# Patient Record
Sex: Male | Born: 1980 | Race: White | Hispanic: No | Marital: Single | Smoking: Former smoker
Health system: Southern US, Community
[De-identification: ages and names within clinical notes are randomized; demographics above are authoritative.]

## PROBLEM LIST (undated history)

## (undated) HISTORY — PX: WRIST SURGERY: SHX841

---

## 1999-04-26 ENCOUNTER — Observation Stay (HOSPITAL_COMMUNITY): Admission: EM | Admit: 1999-04-26 | Discharge: 1999-04-26 | Payer: Self-pay | Admitting: *Deleted

## 1999-05-07 ENCOUNTER — Encounter: Admission: RE | Admit: 1999-05-07 | Discharge: 1999-06-03 | Payer: Self-pay | Admitting: Orthopedic Surgery

## 1999-06-03 ENCOUNTER — Encounter: Admission: RE | Admit: 1999-06-03 | Discharge: 1999-09-01 | Payer: Self-pay | Admitting: Orthopedic Surgery

## 1999-07-27 ENCOUNTER — Encounter: Payer: Self-pay | Admitting: Emergency Medicine

## 1999-07-27 ENCOUNTER — Emergency Department (HOSPITAL_COMMUNITY): Admission: EM | Admit: 1999-07-27 | Discharge: 1999-07-27 | Payer: Self-pay | Admitting: Emergency Medicine

## 2000-03-21 ENCOUNTER — Encounter: Payer: Self-pay | Admitting: Emergency Medicine

## 2000-03-21 ENCOUNTER — Emergency Department (HOSPITAL_COMMUNITY): Admission: EM | Admit: 2000-03-21 | Discharge: 2000-03-22 | Payer: Self-pay | Admitting: Emergency Medicine

## 2000-08-13 ENCOUNTER — Emergency Department (HOSPITAL_COMMUNITY): Admission: EM | Admit: 2000-08-13 | Discharge: 2000-08-13 | Payer: Self-pay | Admitting: Podiatry

## 2003-03-25 ENCOUNTER — Inpatient Hospital Stay (HOSPITAL_COMMUNITY): Admission: EM | Admit: 2003-03-25 | Discharge: 2003-03-26 | Payer: Self-pay | Admitting: *Deleted

## 2004-07-06 ENCOUNTER — Emergency Department (HOSPITAL_COMMUNITY): Admission: EM | Admit: 2004-07-06 | Discharge: 2004-07-06 | Payer: Self-pay | Admitting: Family Medicine

## 2004-07-07 ENCOUNTER — Ambulatory Visit (HOSPITAL_COMMUNITY): Admission: RE | Admit: 2004-07-07 | Discharge: 2004-07-07 | Payer: Self-pay | Admitting: *Deleted

## 2004-07-07 ENCOUNTER — Ambulatory Visit (HOSPITAL_BASED_OUTPATIENT_CLINIC_OR_DEPARTMENT_OTHER): Admission: RE | Admit: 2004-07-07 | Discharge: 2004-07-07 | Payer: Self-pay | Admitting: *Deleted

## 2004-09-16 ENCOUNTER — Emergency Department (HOSPITAL_COMMUNITY): Admission: EM | Admit: 2004-09-16 | Discharge: 2004-09-16 | Payer: Self-pay | Admitting: Emergency Medicine

## 2006-03-01 ENCOUNTER — Emergency Department (HOSPITAL_COMMUNITY): Admission: EM | Admit: 2006-03-01 | Discharge: 2006-03-01 | Payer: Self-pay | Admitting: Emergency Medicine

## 2006-03-30 ENCOUNTER — Emergency Department (HOSPITAL_COMMUNITY): Admission: EM | Admit: 2006-03-30 | Discharge: 2006-03-31 | Payer: Self-pay | Admitting: *Deleted

## 2007-06-22 ENCOUNTER — Emergency Department (HOSPITAL_COMMUNITY): Admission: EM | Admit: 2007-06-22 | Discharge: 2007-06-23 | Payer: Self-pay | Admitting: Emergency Medicine

## 2010-04-10 ENCOUNTER — Emergency Department (HOSPITAL_COMMUNITY): Admission: EM | Admit: 2010-04-10 | Discharge: 2010-04-10 | Payer: Self-pay | Admitting: Emergency Medicine

## 2010-09-02 ENCOUNTER — Emergency Department (HOSPITAL_COMMUNITY)
Admission: EM | Admit: 2010-09-02 | Discharge: 2010-09-02 | Disposition: A | Discharge status: Home / Self Care | Payer: No Typology Code available for payment source | Attending: Emergency Medicine | Admitting: Emergency Medicine

## 2010-09-02 ENCOUNTER — Emergency Department (HOSPITAL_COMMUNITY): Payer: No Typology Code available for payment source

## 2010-09-02 DIAGNOSIS — Z23 Encounter for immunization: Secondary | ICD-10-CM | POA: Insufficient documentation

## 2010-09-02 DIAGNOSIS — S51009A Unspecified open wound of unspecified elbow, initial encounter: Secondary | ICD-10-CM | POA: Insufficient documentation

## 2010-09-02 DIAGNOSIS — M542 Cervicalgia: Secondary | ICD-10-CM | POA: Insufficient documentation

## 2010-09-02 DIAGNOSIS — S5000XA Contusion of unspecified elbow, initial encounter: Secondary | ICD-10-CM | POA: Insufficient documentation

## 2010-09-02 DIAGNOSIS — M25529 Pain in unspecified elbow: Secondary | ICD-10-CM | POA: Insufficient documentation

## 2010-09-02 DIAGNOSIS — IMO0002 Reserved for concepts with insufficient information to code with codable children: Secondary | ICD-10-CM | POA: Insufficient documentation

## 2010-09-08 ENCOUNTER — Emergency Department (HOSPITAL_COMMUNITY)
Admission: EM | Admit: 2010-09-08 | Discharge: 2010-09-09 | Disposition: A | Discharge status: Home / Self Care | Payer: No Typology Code available for payment source | Attending: Emergency Medicine | Admitting: Emergency Medicine

## 2010-09-08 DIAGNOSIS — M542 Cervicalgia: Secondary | ICD-10-CM | POA: Insufficient documentation

## 2010-09-08 DIAGNOSIS — M25519 Pain in unspecified shoulder: Secondary | ICD-10-CM | POA: Insufficient documentation

## 2010-09-08 DIAGNOSIS — M549 Dorsalgia, unspecified: Secondary | ICD-10-CM | POA: Insufficient documentation

## 2010-09-08 DIAGNOSIS — M79609 Pain in unspecified limb: Secondary | ICD-10-CM | POA: Insufficient documentation

## 2010-09-08 DIAGNOSIS — T148XXA Other injury of unspecified body region, initial encounter: Secondary | ICD-10-CM | POA: Insufficient documentation

## 2010-09-08 DIAGNOSIS — Y929 Unspecified place or not applicable: Secondary | ICD-10-CM | POA: Insufficient documentation

## 2010-10-10 NOTE — Op Note (Signed)
NAMEDONTARIOUS, SCHAUM               ACCOUNT NO.:  [id number]   MEDICAL RECORD NO.:  [id number]          PATIENT TYPE:  AMB   LOCATION:  DSC                          FACILITY:  [facility]   PHYSICIAN:  Lowell Bouton, M.D.DATE OF BIRTH:  September 07, 1980   DATE OF PROCEDURE:  07/07/2004  DATE OF DISCHARGE:                                 OPERATIVE REPORT   PREOP DIAGNOSIS:  Septic arthritis left middle finger PIP joint.   POSTOP DIAGNOSIS:  Septic arthritis left middle finger PIP joint.   PROCEDURE:  Procedure is I&D of septic arthritis left middle finger PIP  joint.   SURGEON:  Lowell Bouton, M.D.   ANESTHESIA:  General.   OPERATIVE FINDINGS:  The patient had an erythematous infected laceration  just dorsal to the PIP joint that extended down through the central slip.  The joint itself had thick fluid present.   PROCEDURE:  Under general anesthesia with a tourniquet on the left arm, the  left hand was prepped and draped in the usual fashion. After elevating the  limb the tourniquet was inflated to 250 mmHg. The previous laceration was  spread open and the scab was excised. Laceration was extended in a curved  fashion longitudinally on the dorsum of the PIP of the left middle finger.  Sharp dissection was carried down to the extensor mechanism. The razor had  cut through a portion of the central slip. A longitudinal incision was then  made in the extensor mechanism. An arthrotomy of the joint was performed.  Purulent material was obtained and cultures were sent to the lab. The wound  was irrigated copiously with saline and an Iodoform packing measuring 1/4  inch in width was inserted into the joint. The wound was partially closed  with 4-0 nylon sutures. Sterile dressings were applied. 0.5% Marcaine was  inserted for pain control and the patient was placed in a sterile dressing  with a splint on the finger. He went to the recovery room awake and stable  in good  condition.      EMM/MEDQ  D:  07/07/2004  T:  07/07/2004  Job:  [id number]

## 2011-02-12 LAB — BASIC METABOLIC PANEL
BUN: 10
Calcium: 9.3
GFR calc non Af Amer: >60
Glucose, Bld: 97

## 2011-02-12 LAB — DIFFERENTIAL
Basophils Absolute: 0
Eosinophils Relative: 7 — ABNORMAL HIGH
Lymphocytes Relative: 30
Neutro Abs: 4.4

## 2011-02-12 LAB — RAPID URINE DRUG SCREEN, HOSP PERFORMED
Barbiturates: NOT DETECTED
Benzodiazepines: POSITIVE — AB

## 2011-02-12 LAB — CBC
HCT: 45.8
Platelets: 369
RDW: 13.4

## 2013-09-03 ENCOUNTER — Encounter (HOSPITAL_COMMUNITY): Payer: Self-pay | Admitting: Emergency Medicine

## 2013-09-03 ENCOUNTER — Emergency Department (HOSPITAL_COMMUNITY): Payer: No Typology Code available for payment source

## 2013-09-03 ENCOUNTER — Emergency Department (HOSPITAL_COMMUNITY)
Admission: EM | Admit: 2013-09-03 | Discharge: 2013-09-03 | Disposition: A | Discharge status: Home / Self Care | Payer: Self-pay | Attending: Emergency Medicine | Admitting: Emergency Medicine

## 2013-09-03 DIAGNOSIS — Y9389 Activity, other specified: Secondary | ICD-10-CM | POA: Insufficient documentation

## 2013-09-03 DIAGNOSIS — R1084 Generalized abdominal pain: Secondary | ICD-10-CM | POA: Insufficient documentation

## 2013-09-03 DIAGNOSIS — IMO0002 Reserved for concepts with insufficient information to code with codable children: Secondary | ICD-10-CM | POA: Insufficient documentation

## 2013-09-03 DIAGNOSIS — T07XXXA Unspecified multiple injuries, initial encounter: Secondary | ICD-10-CM

## 2013-09-03 DIAGNOSIS — R079 Chest pain, unspecified: Secondary | ICD-10-CM | POA: Insufficient documentation

## 2013-09-03 DIAGNOSIS — F17200 Nicotine dependence, unspecified, uncomplicated: Secondary | ICD-10-CM | POA: Insufficient documentation

## 2013-09-03 DIAGNOSIS — Y92410 Unspecified street and highway as the place of occurrence of the external cause: Secondary | ICD-10-CM | POA: Insufficient documentation

## 2013-09-03 LAB — BASIC METABOLIC PANEL
BUN: 9 mg/dL (ref 6–23)
CALCIUM: 8.9 mg/dL (ref 8.4–10.5)
CHLORIDE: 102 meq/L (ref 96–112)
CO2: 21 mEq/L (ref 19–32)
CREATININE: 0.66 mg/dL (ref 0.50–1.35)
GFR calc non Af Amer: >90 mL/min (ref >90)
Glucose, Bld: 121 mg/dL — ABNORMAL HIGH (ref 70–99)
Potassium: 3.9 mEq/L (ref 3.7–5.3)
SODIUM: 143 meq/L (ref 137–147)

## 2013-09-03 LAB — CBC WITH DIFFERENTIAL/PLATELET
BASOS ABS: 0.1 10*3/uL (ref 0.0–0.1)
Basophils Relative: 1 % (ref 0–1)
Eosinophils Absolute: 0.2 10*3/uL (ref 0.0–0.7)
Eosinophils Relative: 2 % (ref 0–5)
HCT: 41.2 % (ref 39.0–52.0)
Hemoglobin: 14.6 g/dL (ref 13.0–17.0)
LYMPHS ABS: 2.8 10*3/uL (ref 0.7–4.0)
LYMPHS PCT: 39 % (ref 12–46)
MCH: 33.8 pg (ref 26.0–34.0)
MCHC: 35.4 g/dL (ref 30.0–36.0)
MCV: 95.4 fL (ref 78.0–100.0)
Monocytes Absolute: 0.8 10*3/uL (ref 0.1–1.0)
Monocytes Relative: 11 % (ref 3–12)
NEUTROS ABS: 3.2 10*3/uL (ref 1.7–7.7)
Neutrophils Relative %: 47 % (ref 43–77)
PLATELETS: 245 10*3/uL (ref 150–400)
RBC: 4.32 MIL/uL (ref 4.22–5.81)
RDW: 13.4 % (ref 11.5–15.5)
WBC: 7 10*3/uL (ref 4.0–10.5)

## 2013-09-03 LAB — ETHANOL: ALCOHOL ETHYL (B): 151 mg/dL — AB (ref 0–11)

## 2013-09-03 MED ORDER — IBUPROFEN 800 MG PO TABS: 800.0000 mg | ORAL_TABLET | Freq: Three times a day (TID) | ORAL | Status: AC

## 2013-09-03 MED ORDER — IOHEXOL 300 MG/ML  SOLN
100.0000 mL | Freq: Once | INTRAMUSCULAR | Status: AC | PRN
  Administered 2013-09-03: 100 mL via INTRAVENOUS

## 2013-09-03 MED ORDER — METHOCARBAMOL 500 MG PO TABS: 500.0000 mg | ORAL_TABLET | Freq: Two times a day (BID) | ORAL | Status: AC

## 2013-09-03 MED ORDER — MORPHINE SULFATE 4 MG/ML IJ SOLN
6.0000 mg | Freq: Once | INTRAMUSCULAR | Status: AC
  Administered 2013-09-03: 6 mg via INTRAVENOUS
  Filled 2013-09-03: qty 2

## 2013-09-03 MED ORDER — HYDROCODONE-ACETAMINOPHEN 5-325 MG PO TABS: 1.0000 | ORAL_TABLET | Freq: Four times a day (QID) | ORAL | Status: AC | PRN

## 2013-09-03 NOTE — ED Notes (Signed)
Wound care performed to facial abrasions, covered with antibiotic ointment. Pt tolerated without difficulty.

## 2013-09-03 NOTE — ED Provider Notes (Signed)
Medical screening examination/treatment/procedure(s) were conducted as a shared visit with non-physician practitioner(s) and myself.  I personally evaluated the patient during the encounter.   EKG Interpretation None        Joya Gaskinsonald W Leatta Alewine, MD 09/03/13 1549
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
## 2013-09-03 NOTE — ED Notes (Signed)
Pt returned to exam room from CT. Vital signs stable. He remains alert and oriented x4. No signs of distress noted at the time.
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
## 2016-12-28 ENCOUNTER — Emergency Department (HOSPITAL_COMMUNITY): Payer: Self-pay

## 2016-12-28 ENCOUNTER — Emergency Department (HOSPITAL_COMMUNITY)
Admission: EM | Admit: 2016-12-28 | Discharge: 2016-12-29 | Disposition: A | Discharge status: Home / Self Care | Payer: Self-pay | Attending: Emergency Medicine | Admitting: Emergency Medicine

## 2016-12-28 ENCOUNTER — Encounter (HOSPITAL_COMMUNITY): Payer: Self-pay | Admitting: Emergency Medicine

## 2016-12-28 DIAGNOSIS — L03114 Cellulitis of left upper limb: Secondary | ICD-10-CM | POA: Insufficient documentation

## 2016-12-28 DIAGNOSIS — F17200 Nicotine dependence, unspecified, uncomplicated: Secondary | ICD-10-CM | POA: Insufficient documentation

## 2016-12-28 DIAGNOSIS — Z79899 Other long term (current) drug therapy: Secondary | ICD-10-CM | POA: Insufficient documentation
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
## 2016-12-28 NOTE — ED Provider Notes (Addendum)
[facility]-EMERGENCY DEPT Provider Note   CSN: [id number] Arrival date & time: 12/28/16  2251     History   Chief Complaint Chief Complaint  Patient presents with  . Hand Pain    HPI Antonio Mendez is a 36 y.o. male.  Patient presents to the ER for evaluation of pain and swelling of the left hand. Patient reports that he was stuck with a stick between the index and middle fingers approximately 3 days ago. The area scabbed over and was doing well but in the last day or so he has started having redness and swelling around the area. There is no drainage. No fever. No numbness, tingling or weakness of the hand.      History reviewed. No pertinent past medical history.  There are no active problems to display for this patient.   Past Surgical History:  Procedure Laterality Date  . WRIST SURGERY         Home Medications    Prior to Admission medications   Medication Sig Start Date End Date Taking? Authorizing Provider  HYDROcodone-acetaminophen (NORCO/VICODIN) 5-325 MG per tablet Take 1 tablet by mouth every 6 (six) hours as needed for severe pain. 09/03/13   Fayrene Helperran, [place], PA-C  ibuprofen (ADVIL,MOTRIN) 800 MG tablet Take 1 tablet (800 mg total) by mouth 3 (three) times daily. 09/03/13   Fayrene Helperran, [place], PA-C  methocarbamol (ROBAXIN) 500 MG tablet Take 1 tablet (500 mg total) by mouth 2 (two) times daily. 09/03/13   Fayrene Helperran, [place], PA-C  sulfamethoxazole-trimethoprim (BACTRIM DS,SEPTRA DS) 800-160 MG tablet Take 2 tablets by mouth 2 (two) times daily. 12/29/16   Gilda CreasePollina, [place] Marcucci J, MD  traMADol (ULTRAM) 50 MG tablet Take 1 tablet (50 mg total) by mouth every 6 (six) hours as needed. 12/29/16   Gilda CreasePollina, Krisna Omar J, MD  vitamin C (ASCORBIC ACID) 500 MG tablet Take 500 mg by mouth daily.    [provider]    Family History History reviewed. No pertinent family history.  Social History Social History  Substance Use Topics  . Smoking status: Current Some Day Smoker  .  Smokeless tobacco: Never Used  . Alcohol use 10.8 oz/week    18 Cans of beer per week     Comment: a day     Allergies   Patient has no known allergies.   Review of Systems Review of Systems  Constitutional: Negative for fever.  Skin: Positive for wound.  All other systems reviewed and are negative.    Physical Exam Updated Vital Signs BP (!) 151/100   Pulse 74   Temp 97.9 F (36.6 C)   Resp 18   Ht 5\' 10"  (1.778 m)   Wt 72.6 kg (160 lb)   SpO2 97%   BMI 22.96 kg/m   Physical Exam  Constitutional: He is oriented to person, place, and time. He appears well-developed and well-nourished. No distress.  HENT:  Head: Normocephalic and atraumatic.  Right Ear: Hearing normal.  Left Ear: Hearing normal.  Nose: Nose normal.  Mouth/Throat: Oropharynx is clear and moist and mucous membranes are normal.  Eyes: Pupils are equal, round, and reactive to light. Conjunctivae and EOM are normal.  Neck: Normal range of motion. Neck supple.  Cardiovascular: Regular rhythm, S1 normal and S2 normal.  Exam reveals no gallop and no friction rub.   No murmur heard. Pulmonary/Chest: Effort normal and breath sounds normal. No respiratory distress. He exhibits no tenderness.  Abdominal: Soft. Normal appearance and bowel sounds are normal. There is  no hepatosplenomegaly. There is no tenderness. There is no rebound, no guarding, no tenderness at McBurney's point and negative Murphy's sign. No hernia.  Musculoskeletal: Normal range of motion.       Left hand: He exhibits tenderness.       Hands: Neurological: He is alert and oriented to person, place, and time. He has normal strength. No cranial nerve deficit or sensory deficit. Coordination normal. GCS eye subscore is 4. GCS verbal subscore is 5. GCS motor subscore is 6.  Skin: Skin is warm, dry and intact. No rash noted. There is erythema (left hand). No cyanosis.  Psychiatric: He has a normal mood and affect. His speech is normal and behavior  is normal. Thought content normal.  Nursing note and vitals reviewed.        ED Treatments / Results  Labs (all labs ordered are listed, but only abnormal results are displayed) Labs Reviewed  CBC WITH DIFFERENTIAL/PLATELET  BASIC METABOLIC PANEL    EKG  EKG Interpretation None       Radiology Dg Hand Complete Left  Result Date: 12/29/2016 CLINICAL DATA:  Stuck stick in left hand, with pain and swelling about the second and third metacarpal heads. Initial encounter. EXAM: LEFT HAND - COMPLETE 3+ VIEW COMPARISON:  None. FINDINGS: There is no evidence of acute fracture or dislocation. A 3 mm radiopaque foreign body is noted overlying the web between the thumb and index finger. Surrounding soft tissue swelling is noted. Visualized joint spaces are preserved. A displaced ulnar styloid fibroid likely reflects remote injury. The carpal rows appear grossly intact. IMPRESSION: 1. No evidence of acute fracture or dislocation. 2. 3 mm radiopaque foreign body overlying the web between the thumb and index finger. Electronically Signed   By: Roanna Raider M.D.   On: 12/29/2016 00:10    Procedures [place] bedside Date/Time: 12/29/2016 2:03 AM Performed by: Gilda Crease Authorized by: Gilda Crease  Consent: Verbal consent obtained. Risks and benefits: risks, benefits and alternatives were discussed Consent given by: patient Patient understanding: patient states understanding of the procedure being performed Site marked: the operative site was marked Required items: required blood products, implants, devices, and special equipment available Patient identity confirmed: verbally with patient Time out: Immediately prior to procedure a "time out" was called to verify the correct patient, procedure, equipment, support staff and site/side marked as required. Local anesthesia used: no  Anesthesia: Local anesthesia used: no  Sedation: Patient sedated: no Comments: Linear probe  utilized to evaluate the entire area of erythema and swelling. Soft tissue swelling noted but no evidence of foreign body noted. No fluid collection or abscess noted. Images stored.    (including critical care time)   Medications Ordered in ED Medications  vancomycin (VANCOCIN) IVPB 1000 mg/200 mL premix (1,000 mg Intravenous New Bag/Given 12/29/16 0053)     Initial Impression / Assessment and Plan / ED Course  I have reviewed the triage vital signs and the nursing notes.  Pertinent labs & imaging results that were available during my care of the patient were reviewed by me and considered in my medical decision making (see chart for details).     Patient presents to the emergency department for evaluation of pain and swelling of his left hand. Patient reports that he was stuck between the second and third fingers with a stick approximately 3 days ago. He pulled a stick out, reports that initially the area started to heal fine but now he has had pain, redness and  swelling. Examination does reveal evidence of focal infection of the soft tissue. X-ray does not show any abnormality. There is a radiopaque foreign body between, and index finger, but this appears to be old as it is not near the wound or area of inflammation. No foreign body noted between second and third fingers where the infection is. I also evaluated the area extensively with ultrasound and did not see any foreign body present.  Patient has normal extension of fingers with normal sensation. There is no evidence of tendon involvement. Infection appears to be centered between the tendons at the area between second and third MCP joint and metacarpals.  Patient administered IV vancomycin. Will be discharged on high-dose Bactrim and follow-up in 2 days with orthopedics. Return to the ER at any time if he has any worsening symptoms.  Final Clinical Impressions(s) / ED Diagnoses   Final diagnoses:  Cellulitis of left upper extremity     New Prescriptions New Prescriptions   SULFAMETHOXAZOLE-TRIMETHOPRIM (BACTRIM DS,SEPTRA DS) 800-160 MG TABLET    Take 2 tablets by mouth 2 (two) times daily.   TRAMADOL (ULTRAM) 50 MG TABLET    Take 1 tablet (50 mg total) by mouth every 6 (six) hours as needed.     Gilda Crease, MD 12/29/16 1610    Gilda Crease, MD 12/29/16 [phone]

## 2016-12-29 LAB — CBC WITH DIFFERENTIAL/PLATELET
BASOS ABS: 0.1 10*3/uL (ref 0.0–0.1)
BASOS PCT: 1 %
EOS ABS: 0.4 10*3/uL (ref 0.0–0.7)
EOS PCT: 6 %
HCT: 39.6 % (ref 39.0–52.0)
HEMOGLOBIN: 13.4 g/dL (ref 13.0–17.0)
Lymphocytes Relative: 28 %
Lymphs Abs: 2 10*3/uL (ref 0.7–4.0)
MCH: 32.8 pg (ref 26.0–34.0)
MCHC: 33.8 g/dL (ref 30.0–36.0)
MCV: 97.1 fL (ref 78.0–100.0)
Monocytes Absolute: 1.1 10*3/uL — ABNORMAL HIGH (ref 0.1–1.0)
Monocytes Relative: 14 %
NEUTROS PCT: 51 %
Neutro Abs: 3.8 10*3/uL (ref 1.7–7.7)
PLATELETS: 232 10*3/uL (ref 150–400)
RBC: 4.08 MIL/uL — AB (ref 4.22–5.81)
RDW: 13.8 % (ref 11.5–15.5)
WBC: 7.4 10*3/uL (ref 4.0–10.5)

## 2016-12-29 LAB — BASIC METABOLIC PANEL
Anion gap: 11 (ref 5–15)
BUN: 7 mg/dL (ref 6–20)
CO2: 24 mmol/L (ref 22–32)
Calcium: 8.9 mg/dL (ref 8.9–10.3)
Chloride: 106 mmol/L (ref 101–111)
Creatinine, Ser: 0.77 mg/dL (ref 0.61–1.24)
Glucose, Bld: 109 mg/dL — ABNORMAL HIGH (ref 65–99)
POTASSIUM: 3.6 mmol/L (ref 3.5–5.1)
SODIUM: 141 mmol/L (ref 135–145)

## 2016-12-29 MED ORDER — VANCOMYCIN HCL IN DEXTROSE 1-5 GM/200ML-% IV SOLN
1000.0000 mg | Freq: Once | INTRAVENOUS | Status: AC
  Administered 2016-12-29: 1000 mg via INTRAVENOUS
  Filled 2016-12-29: qty 200

## 2016-12-29 MED ORDER — TRAMADOL HCL 50 MG PO TABS: 50.0000 mg | ORAL_TABLET | Freq: Four times a day (QID) | ORAL | 0 refills | Status: AC | PRN

## 2016-12-29 MED ORDER — SULFAMETHOXAZOLE-TRIMETHOPRIM 800-160 MG PO TABS: 2.0000 | ORAL_TABLET | Freq: Two times a day (BID) | ORAL | 0 refills | Status: AC

## 2016-12-29 NOTE — Discharge Instructions (Signed)
Schedule follow-up with Dr. Romeo AppleHarrison for recheck in 2 days. If you cannot be seen by Dr. Romeo AppleHarrison in the office, return to the ER for a recheck. If at any time you feel like the pain, redness, swelling is worsening or you develop fever or drainage from the area, return to the ER for repeat examination.

## 2016-12-29 NOTE — ED Notes (Signed)
Patient verbalizes understanding of discharge instructions, prescriptions, home care and follow up care. Patient out of department at this time. 

## 2018-08-20 ENCOUNTER — Other Ambulatory Visit: Payer: Self-pay

## 2018-08-20 ENCOUNTER — Emergency Department (HOSPITAL_COMMUNITY)
Admission: EM | Admit: 2018-08-20 | Discharge: 2018-08-20 | Disposition: A | Discharge status: Home / Self Care | Payer: Self-pay | Attending: Emergency Medicine | Admitting: Emergency Medicine

## 2018-08-20 ENCOUNTER — Encounter (HOSPITAL_COMMUNITY): Payer: Self-pay | Admitting: Emergency Medicine

## 2018-08-20 ENCOUNTER — Emergency Department (HOSPITAL_COMMUNITY): Payer: Self-pay

## 2018-08-20 DIAGNOSIS — Y990 Civilian activity done for income or pay: Secondary | ICD-10-CM | POA: Insufficient documentation

## 2018-08-20 DIAGNOSIS — S81802A Unspecified open wound, left lower leg, initial encounter: Secondary | ICD-10-CM | POA: Insufficient documentation

## 2018-08-20 DIAGNOSIS — Z87891 Personal history of nicotine dependence: Secondary | ICD-10-CM | POA: Insufficient documentation

## 2018-08-20 DIAGNOSIS — W228XXA Striking against or struck by other objects, initial encounter: Secondary | ICD-10-CM | POA: Insufficient documentation

## 2018-08-20 DIAGNOSIS — L089 Local infection of the skin and subcutaneous tissue, unspecified: Secondary | ICD-10-CM | POA: Insufficient documentation

## 2018-08-20 DIAGNOSIS — Y929 Unspecified place or not applicable: Secondary | ICD-10-CM | POA: Insufficient documentation

## 2018-08-20 DIAGNOSIS — T148XXA Other injury of unspecified body region, initial encounter: Secondary | ICD-10-CM

## 2018-08-20 DIAGNOSIS — Y93H2 Activity, gardening and landscaping: Secondary | ICD-10-CM | POA: Insufficient documentation

## 2018-08-20 LAB — CBC WITH DIFFERENTIAL/PLATELET
Abs Immature Granulocytes: 0.03 10*3/uL (ref 0.00–0.07)
BASOS ABS: 0.1 10*3/uL (ref 0.0–0.1)
Basophils Relative: 1 %
Eosinophils Absolute: 0.5 10*3/uL (ref 0.0–0.5)
Eosinophils Relative: 5 %
HEMATOCRIT: 40.2 % (ref 39.0–52.0)
HEMOGLOBIN: 13.7 g/dL (ref 13.0–17.0)
IMMATURE GRANULOCYTES: 0 %
LYMPHS ABS: 1.6 10*3/uL (ref 0.7–4.0)
LYMPHS PCT: 17 %
MCH: 33.5 pg (ref 26.0–34.0)
MCHC: 34.1 g/dL (ref 30.0–36.0)
MCV: 98.3 fL (ref 80.0–100.0)
Monocytes Absolute: 1 10*3/uL (ref 0.1–1.0)
Monocytes Relative: 11 %
NEUTROS PCT: 66 %
Neutro Abs: 6.1 10*3/uL (ref 1.7–7.7)
Platelets: 255 10*3/uL (ref 150–400)
RBC: 4.09 MIL/uL — AB (ref 4.22–5.81)
RDW: 13.1 % (ref 11.5–15.5)
WBC: 9.3 10*3/uL (ref 4.0–10.5)
nRBC: 0 % (ref 0.0–0.2)

## 2018-08-20 LAB — BASIC METABOLIC PANEL
ANION GAP: 14 (ref 5–15)
BUN: 10 mg/dL (ref 6–20)
CALCIUM: 9 mg/dL (ref 8.9–10.3)
CHLORIDE: 103 mmol/L (ref 98–111)
CO2: 21 mmol/L — AB (ref 22–32)
CREATININE: 0.61 mg/dL (ref 0.61–1.24)
GFR calc non Af Amer: >60 mL/min (ref >60)
Glucose, Bld: 132 mg/dL — ABNORMAL HIGH (ref 70–99)
Potassium: 3.9 mmol/L (ref 3.5–5.1)
SODIUM: 138 mmol/L (ref 135–145)

## 2018-08-20 MED ORDER — DOXYCYCLINE HYCLATE 100 MG PO CAPS: 100.0000 mg | ORAL_CAPSULE | Freq: Two times a day (BID) | ORAL | 0 refills | Status: AC

## 2018-08-20 MED ORDER — BACITRACIN-NEOMYCIN-POLYMYXIN 400-5-5000 EX OINT
TOPICAL_OINTMENT | Freq: Once | CUTANEOUS | Status: AC
Start: 2018-08-20 — End: 2018-08-20
  Administered 2018-08-20: 1 via TOPICAL
  Filled 2018-08-20: qty 1

## 2018-08-20 MED ORDER — LIDOCAINE HCL (PF) 1 % IJ SOLN
INTRAMUSCULAR | Status: AC
  Administered 2018-08-20: 2 mL
  Filled 2018-08-20: qty 2

## 2018-08-20 MED ORDER — ONDANSETRON HCL 4 MG PO TABS
4.0000 mg | ORAL_TABLET | Freq: Once | ORAL | Status: AC
  Administered 2018-08-20: 4 mg via ORAL
  Filled 2018-08-20: qty 1

## 2018-08-20 MED ORDER — CEFTRIAXONE SODIUM 1 G IJ SOLR
1.0000 g | Freq: Once | INTRAMUSCULAR | Status: AC
  Administered 2018-08-20: 1 g via INTRAMUSCULAR
  Filled 2018-08-20: qty 10

## 2018-08-20 MED ORDER — DOXYCYCLINE HYCLATE 100 MG PO TABS
100.0000 mg | ORAL_TABLET | Freq: Once | ORAL | Status: AC
Start: 2018-08-20 — End: 2018-08-20
  Administered 2018-08-20: 100 mg via ORAL
  Filled 2018-08-20: qty 1

## 2018-08-20 MED ORDER — IBUPROFEN 800 MG PO TABS
800.0000 mg | ORAL_TABLET | Freq: Once | ORAL | Status: AC
  Administered 2018-08-20: 800 mg via ORAL
  Filled 2018-08-20: qty 1

## 2018-08-20 NOTE — Discharge Instructions (Addendum)
Please cleanse the wound to your leg daily with soap and water.  Please apply a fresh bandage daily until it heals.  Please use doxycycline 2 times daily until all taken.  Please see your primary physician or return to the emergency department if you notice red streaks going up your leg, pus like material from the wound, fever that will not respond to Tylenol or ibuprofen, changes in your condition, problems, or concerns.

## 2018-08-20 NOTE — ED Provider Notes (Signed)
[facility] EMERGENCY DEPARTMENT Provider Note   CSN: [id number] Arrival date & time: 08/20/18  1205    History   Chief Complaint Chief Complaint  Patient presents with  . Wound Check    HPI Antonio Mendez is a 38 y.o. male.     Patient is a 38 year old male who presents to the emergency department with a complaint of wound to his lower leg.  The patient states that approximately a month ago he had a puncture wound to the left leg just below the knee.  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  He removed the stick.  He has been cleansing the area with soap and water and alcohol.  He has been noticing that 1 of the areas healed nicely, but a second area has not healed.  He is noticing increased redness and pain over the last 3 to 4days.  And some increased redness that seems to be going down his leg.  He says he has some discomfort when he walks.  No fever or chills reported.  He has been trying Neosporin over the last couple of days, but this is not helping.  The patient states he is up-to-date on his tetanus.  He does not have any medical issues that would interfere with his immune system.  No recent operations or procedures involving the left lower extremity.  The history is provided by the patient.  Wound Check  Pertinent negatives include no chest pain, no abdominal pain and no shortness of breath.    History reviewed. No pertinent past medical history.  There are no active problems to display for this patient.   Past Surgical History:  Procedure Laterality Date  . WRIST SURGERY          Home Medications    Prior to Admission medications   Medication Sig Start Date End Date Taking? Authorizing Provider  HYDROcodone-acetaminophen (NORCO/VICODIN) 5-325 MG per tablet Take 1 tablet by mouth every 6 (six) hours as needed for severe pain. Patient not taking: Reported on 08/20/2018 09/03/13   Fayrene Helper, PA-C  ibuprofen  (ADVIL,MOTRIN) 800 MG tablet Take 1 tablet (800 mg total) by mouth 3 (three) times daily. Patient not taking: Reported on 08/20/2018 09/03/13   Fayrene Helper, PA-C  methocarbamol (ROBAXIN) 500 MG tablet Take 1 tablet (500 mg total) by mouth 2 (two) times daily. Patient not taking: Reported on 08/20/2018 09/03/13   Fayrene Helper, PA-C  sulfamethoxazole-trimethoprim (BACTRIM DS,SEPTRA DS) 800-160 MG tablet Take 2 tablets by mouth 2 (two) times daily. Patient not taking: Reported on 08/20/2018 12/29/16   Gilda Crease, MD  traMADol (ULTRAM) 50 MG tablet Take 1 tablet (50 mg total) by mouth every 6 (six) hours as needed. Patient not taking: Reported on 08/20/2018 12/29/16   Gilda Crease, MD    Family History No family history on file.  Social History Social History   Tobacco Use  . Smoking status: Former Smoker    Types: Cigarettes    Last attempt to quit: 05/25/2017    Years since quitting: 1.2  . Smokeless tobacco: Never Used  Substance Use Topics  . Alcohol use: Yes    Alcohol/week: 18.0 standard drinks    Types: 18 Cans of beer per week    Comment: a day  . Drug use: No     Allergies   Patient has no known allergies.   Review of Systems Review of Systems  Constitutional: Negative for activity change.       All ROS Neg except as noted in HPI  HENT: Negative for nosebleeds.   Eyes: Negative for photophobia and discharge.  Respiratory: Negative for cough, shortness of breath and wheezing.   Cardiovascular: Negative for chest pain and palpitations.  Gastrointestinal: Negative for abdominal pain and blood in stool.  Genitourinary: Negative for dysuria, frequency and hematuria.  Musculoskeletal: Negative for arthralgias, back pain and neck pain.  Skin: Positive for wound.       Wound to the left leg.  Neurological: Negative for dizziness, seizures and speech difficulty.  Psychiatric/Behavioral: Negative for confusion and hallucinations.     Physical Exam Updated  Vital Signs BP 139/84 (BP Location: Right Arm)   Pulse 76   Temp 98.2 F (36.8 C) (Oral)   Resp 18   Ht 5\' 10"  (1.778 m)   Wt 72.6 kg   SpO2 96%   BMI 22.96 kg/m   Physical Exam Vitals signs and nursing note reviewed.  Constitutional:      Appearance: He is well-developed. He is not toxic-appearing.  HENT:     Head: Normocephalic.     Right Ear: Tympanic membrane and external ear normal.     Left Ear: Tympanic membrane and external ear normal.  Eyes:     General: Lids are normal.     Pupils: Pupils are equal, round, and reactive to light.  Neck:     Musculoskeletal: Normal range of motion and neck supple.     Vascular: No carotid bruit.  Cardiovascular:     Rate and Rhythm: Normal rate and regular rhythm.     Pulses: Normal pulses.     Heart sounds: Normal heart sounds.  Pulmonary:     Effort: No respiratory distress.     Breath sounds: Normal breath sounds.  Abdominal:     General: Bowel sounds are normal.     Palpations: Abdomen is soft.     Tenderness: There is no abdominal tenderness. There is no guarding.  Musculoskeletal: Normal range of motion.     Comments: There is a healing scabbed wound just below the left anterior tibial tuberosity.  There is an open wound approximately 3-1/2 inches below the scabbed wound.  This has some mild drainage present.  There is increased redness about this area.  There area is warm, but not hot.  There is some increased redness from this wound going to the mid anterior tibial area.  There is no crepitus appreciated.  There is full range of motion of the left ankle and toes.  The dorsalis pedis pulses 2+.  Lymphadenopathy:     Head:     Right side of head: No submandibular adenopathy.     Left side of head: No submandibular adenopathy.     Cervical: No cervical adenopathy.  Skin:    General: Skin is warm and dry.  Neurological:     Mental Status: He is alert and oriented to person, place, and time.     Cranial Nerves: No cranial  nerve deficit.     Sensory: No sensory deficit.  Psychiatric:        Speech: Speech normal.      ED Treatments / Results  Labs (all labs ordered are listed, but only abnormal results are displayed) Labs Reviewed  CBC WITH DIFFERENTIAL/PLATELET  BASIC METABOLIC PANEL    EKG None  Radiology No results found.  Procedures Procedures (including critical care time)  Medications Ordered in ED  Medications - No data to display   Initial Impression / Assessment and Plan / ED Course  I have reviewed the triage vital signs and the nursing notes.  Pertinent labs & imaging results that were available during my care of the patient were reviewed by me and considered in my medical decision making (see chart for details).          Final Clinical Impressions(s) / ED Diagnoses MDM  Vital signs are within normal limits.  Pulse oximetry is 96% on room air.  Within normal limits by my interpretation. Complete blood count is within normal limits.  Basic metabolic panel is nonacute.  X-ray is negative for foreign body, gas, or bony involvement.  I have asked the patient to cleanse the wound daily with soap and water and apply a fresh bandage daily.  Prescription for doxycycline given to the patient to use until all taken.  Have given the patient strict return instructions to return if any signs of advancing infection, worsening of condition, problems, or concerns.  Patient is in agreement with this plan.   Final diagnoses:  Infected wound    ED Discharge Orders    None       Ivery Quale, PA-C 08/20/18 1442    Pricilla Loveless, MD 08/20/18 [phone]

## 2018-08-20 NOTE — ED Triage Notes (Signed)
Patient c/o possible infection to wound on left lower leg, below knee. Per patient punctured skin with a stick x1 month ago, removed stick and cleaned with alcohol. Patient states in past 3-4 days redness and pain has increased. Denies any fevers. Per patient purulent drainage. Patient states tetanus vaccination was up-to-date (in past 2 years).

## 2019-09-29 ENCOUNTER — Emergency Department (HOSPITAL_COMMUNITY): Payer: Self-pay

## 2019-09-29 ENCOUNTER — Other Ambulatory Visit: Payer: Self-pay

## 2019-09-29 ENCOUNTER — Encounter (HOSPITAL_COMMUNITY): Payer: Self-pay | Admitting: *Deleted

## 2019-09-29 ENCOUNTER — Emergency Department (HOSPITAL_COMMUNITY)
Admission: EM | Admit: 2019-09-29 | Discharge: 2019-09-29 | Disposition: A | Discharge status: Home / Self Care | Payer: Self-pay | Attending: Emergency Medicine | Admitting: Emergency Medicine

## 2019-09-29 DIAGNOSIS — Z5321 Procedure and treatment not carried out due to patient leaving prior to being seen by health care provider: Secondary | ICD-10-CM | POA: Insufficient documentation

## 2019-09-29 DIAGNOSIS — M79641 Pain in right hand: Secondary | ICD-10-CM | POA: Insufficient documentation

## 2019-09-29 NOTE — ED Notes (Signed)
Patient not in waiting area when called for xray

## 2019-09-29 NOTE — ED Triage Notes (Signed)
Pain in right hand with swelling, injured 2 days ago lifting an air conditioner

## 2020-06-18 IMAGING — DX LEFT TIBIA AND FIBULA - 2 VIEW
2 series · 2 of 2 positions shown · non-contrast
Comparison: None.

CLINICAL DATA: Possible infection in lower leg.

EXAM:
LEFT TIBIA AND FIBULA - 2 VIEW

[tibia ap]
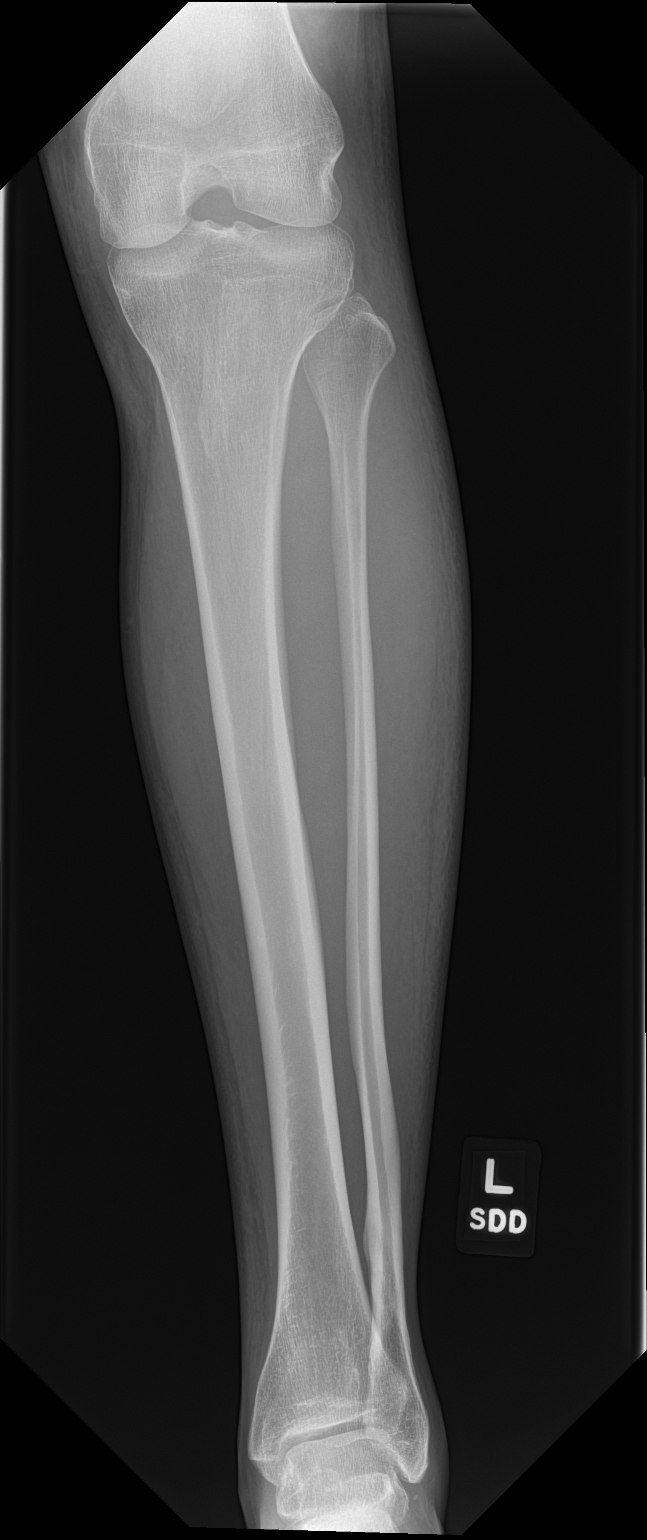

[tibia lat]
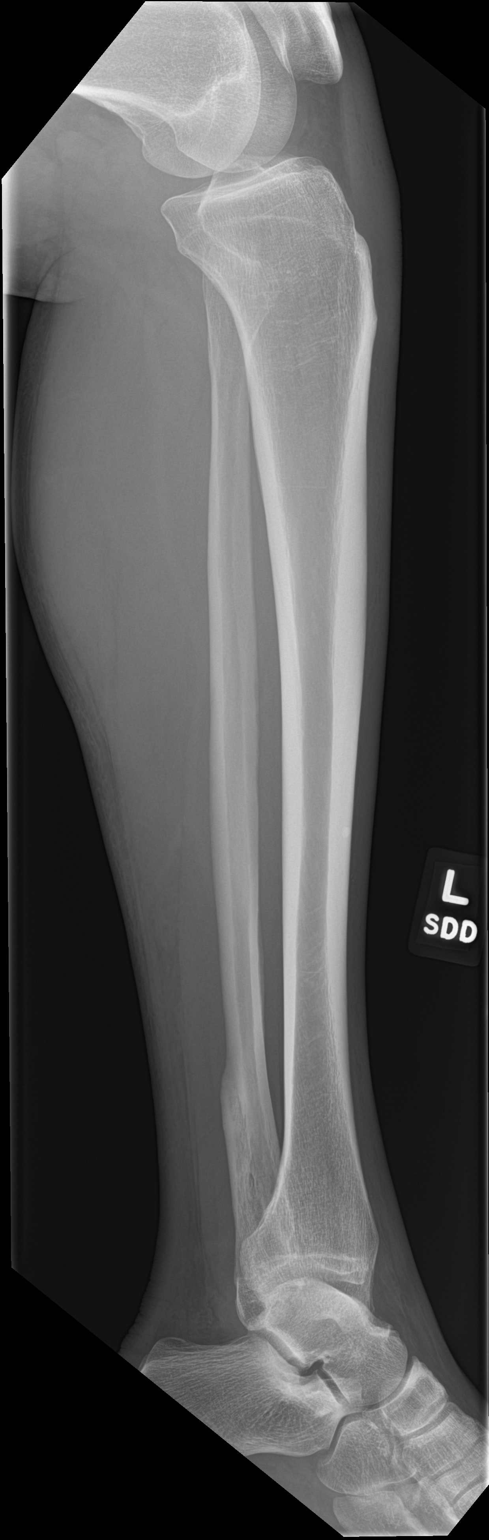

[2 of 2 positions shown; findings below may reference images not displayed]

FINDINGS: The soft tissues are normal with no foreign bodies or air. No acute
fractures noted. Probable remote distal fibular fracture.
IMPRESSION: No acute abnormalities.
